# Patient Record
Sex: Male | Born: 1950 | Race: Black or African American | Hispanic: No | Marital: Married | Smoking: Never smoker
Health system: Southern US, Community
[De-identification: ages and names within clinical notes are randomized; demographics above are authoritative.]

## PROBLEM LIST (undated history)

## (undated) DIAGNOSIS — I10 Essential (primary) hypertension: Secondary | ICD-10-CM

## (undated) DIAGNOSIS — E119 Type 2 diabetes mellitus without complications: Secondary | ICD-10-CM

## (undated) HISTORY — PX: APPENDECTOMY: SHX54

---

## 2016-10-10 ENCOUNTER — Emergency Department (HOSPITAL_BASED_OUTPATIENT_CLINIC_OR_DEPARTMENT_OTHER): Payer: 59

## 2016-10-10 ENCOUNTER — Emergency Department (HOSPITAL_BASED_OUTPATIENT_CLINIC_OR_DEPARTMENT_OTHER)
Admission: EM | Admit: 2016-10-10 | Discharge: 2016-10-10 | Disposition: A | Discharge status: Home / Self Care | Payer: 59 | Attending: Emergency Medicine | Admitting: Emergency Medicine

## 2016-10-10 ENCOUNTER — Encounter (HOSPITAL_BASED_OUTPATIENT_CLINIC_OR_DEPARTMENT_OTHER): Payer: Self-pay

## 2016-10-10 DIAGNOSIS — I493 Ventricular premature depolarization: Secondary | ICD-10-CM | POA: Diagnosis not present

## 2016-10-10 DIAGNOSIS — R0789 Other chest pain: Secondary | ICD-10-CM

## 2016-10-10 DIAGNOSIS — I10 Essential (primary) hypertension: Secondary | ICD-10-CM | POA: Insufficient documentation

## 2016-10-10 DIAGNOSIS — E119 Type 2 diabetes mellitus without complications: Secondary | ICD-10-CM | POA: Diagnosis not present

## 2016-10-10 HISTORY — DX: Essential (primary) hypertension: I10

## 2016-10-10 HISTORY — DX: Type 2 diabetes mellitus without complications: E11.9

## 2016-10-10 LAB — BASIC METABOLIC PANEL
ANION GAP: 8 (ref 5–15)
BUN: 13 mg/dL (ref 6–20)
CALCIUM: 8.9 mg/dL (ref 8.9–10.3)
CO2: 27 mmol/L (ref 22–32)
Chloride: 100 mmol/L — ABNORMAL LOW (ref 101–111)
Creatinine, Ser: 0.82 mg/dL (ref 0.61–1.24)
GFR calc non Af Amer: >60 mL/min (ref >60)
Glucose, Bld: 160 mg/dL — ABNORMAL HIGH (ref 65–99)
Potassium: 3.4 mmol/L — ABNORMAL LOW (ref 3.5–5.1)
Sodium: 135 mmol/L (ref 135–145)

## 2016-10-10 LAB — CBC
HCT: 38.6 % — ABNORMAL LOW (ref 39.0–52.0)
HEMOGLOBIN: 12.7 g/dL — AB (ref 13.0–17.0)
MCH: 27.7 pg (ref 26.0–34.0)
MCHC: 32.9 g/dL (ref 30.0–36.0)
MCV: 84.1 fL (ref 78.0–100.0)
Platelets: 217 10*3/uL (ref 150–400)
RBC: 4.59 MIL/uL (ref 4.22–5.81)
RDW: 12.8 % (ref 11.5–15.5)
WBC: 5.1 10*3/uL (ref 4.0–10.5)

## 2016-10-10 LAB — TROPONIN I

## 2016-10-10 NOTE — ED Notes (Signed)
Updated pt to plan of care for three hour troponin.  Pt still denies chest pain, denies any other symptoms still.  Covered pt and wife with warm blankets and shut off the light, they are currently resting.

## 2016-10-10 NOTE — ED Triage Notes (Addendum)
Pt states that he woke about 30 minutes ago and had a "buzz" sensation in his left chest with nausea.  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  Pt denies SOB, denies diaphoresis, denies referred pain to his neck or jaw or back.  Pt took one baby aspirin prior to arrival and his pain has gone away.

## 2016-10-10 NOTE — ED Notes (Signed)
Pt verbalizes understanding of d/c instructions and denies any further needs at this time. 

## 2016-10-10 NOTE — ED Provider Notes (Signed)
[facility] Provider Note: Lowella Dell, MD, FACEP  CSN: [id number] MRN: [id number] ARRIVAL: 10/10/16 at 0259 ROOM: [facility]   CHIEF COMPLAINT  Chest Pain   HISTORY OF PRESENT ILLNESS  Andrew Cohen is a 66 y.o. male who developed a mild (3/10), vaguely characterized ("almost like a numbness") left chest discomfort this morning about 2:30 AM. The discomfort was not exacerbated with movement or exertion and was not relieved with rest. There was no associated shortness of breath or diaphoresis. He did develop some nausea and took an aspirin tablet with relief of the nausea. The discomfort resolved about the time he arrived in the ED. He has never had a similar discomfort in the past. He was noted to have frequent PVCs but has a history of the same.   Past Medical History:  Diagnosis Date  . Diabetes mellitus without complication (HCC)   . Hypertension     Past Surgical History:  Procedure Laterality Date  . APPENDECTOMY      No family history on file.  Social History  Substance Use Topics  . Smoking status: Never Smoker  . Smokeless tobacco: Never Used  . Alcohol use Yes     Comment: occasionally    Prior to Admission medications   Not on File    Allergies Patient has no known allergies.   REVIEW OF SYSTEMS  Negative except as noted here or in the History of Present Illness.   PHYSICAL EXAMINATION  Initial Vital Signs Blood pressure 135/83, pulse 72, temperature 97.8 F (36.6 C), temperature source Oral, resp. rate 20, height 5\' 10"  (1.778 m), weight 282 lb (127.9 kg), SpO2 96 %.  Examination General: Well-developed, well-nourished male in no acute distress; appearance consistent with age of record HENT: normocephalic; atraumatic Eyes: pupils equal, round and reactive to light; extraocular muscles intact Neck: supple Heart: regular rate and rhythm; no murmurs, rubs or gallops; frequent PVCs with no sustained runs Lungs: clear to auscultation  bilaterally Abdomen: soft; nondistended; nontender; no masses or hepatosplenomegaly; bowel sounds present Extremities: No deformity; full range of motion; pulses normal; trace edema of lower legs Neurologic: Awake, alert and oriented; motor function intact in all extremities and symmetric; no facial droop Skin: Warm and dry Psychiatric: Normal mood and affect   RESULTS  Summary of this visit's results, reviewed by myself:   EKG Interpretation  Date/Time:  Friday October 10 2016 03:08:07 EST Ventricular Rate:  75 PR Interval:    QRS Duration: 111 QT Interval:  420 QTC Calculation: 470 R Axis:   -19 Text Interpretation:  Sinus rhythm Multiple ventricular premature complexes Borderline left axis deviation Low voltage, precordial leads Borderline T abnormalities, anterior leads Baseline wander in lead(s) II III aVF No previous ECGs available Confirmed by Morris Markham  MD, Jonny Ruiz ([id number]) on 10/10/2016 3:46:31 AM      Laboratory Studies: Results for orders placed or performed during the hospital encounter of 10/10/16 (from the past 24 hour(s))  Basic metabolic panel     Status: Abnormal   Collection Time: 10/10/16  3:30 AM  Result Value Ref Range   Sodium 135 135 - 145 mmol/L   Potassium 3.4 (L) 3.5 - 5.1 mmol/L   Chloride 100 (L) 101 - 111 mmol/L   CO2 27 22 - 32 mmol/L   Glucose, Bld 160 (H) 65 - 99 mg/dL   BUN 13 6 - 20 mg/dL   Creatinine, Ser 6.21 0.61 - 1.24 mg/dL   Calcium 8.9 8.9 - 30.8 mg/dL  GFR calc non Af Amer >60 >60 mL/min   GFR calc Af Amer >60 >60 mL/min   Anion gap 8 5 - 15  CBC     Status: Abnormal   Collection Time: 10/10/16  3:30 AM  Result Value Ref Range   WBC 5.1 4.0 - 10.5 K/uL   RBC 4.59 4.22 - 5.81 MIL/uL   Hemoglobin 12.7 (L) 13.0 - 17.0 g/dL   HCT 16.138.6 (L) 09.639.0 - 04.552.0 %   MCV 84.1 78.0 - 100.0 fL   MCH 27.7 26.0 - 34.0 pg   MCHC 32.9 30.0 - 36.0 g/dL   RDW 40.912.8 81.111.5 - 91.415.5 %   Platelets 217 150 - 400 K/uL  Troponin I     Status: None   Collection Time:  10/10/16  3:30 AM  Result Value Ref Range   Troponin I <0.03 <0.03 ng/mL  Troponin I     Status: None   Collection Time: 10/10/16  6:05 AM  Result Value Ref Range   Troponin I <0.03 <0.03 ng/mL   Imaging Studies: Dg Chest 2 View  Result Date: 10/10/2016 CLINICAL DATA:  Fuzzy  sensation in left upper chest 1 hour ago EXAM: CHEST  2 VIEW COMPARISON:  03/17/2016 FINDINGS: Prominent interstitial opacities suggest bronchial thickening. No acute consolidation or effusion. Normal cardiomediastinal silhouette. No pneumothorax. IMPRESSION: No radiographic evidence for acute cardiopulmonary abnormality Electronically Signed   By: Jasmine PangKim  Fujinaga M.D.   On: 10/10/2016 03:52    ED COURSE  Nursing notes and initial vitals signs, including pulse oximetry, reviewed.  Vitals:   10/10/16 0316 10/10/16 0317 10/10/16 0400 10/10/16 0530  BP:  135/83 100/59 115/61  Pulse:  72 66 64  Resp:  20 13 13   Temp:  97.8 F (36.6 C)    TempSrc:  Oral    SpO2:  96% 98% 96%  Weight: 282 lb (127.9 kg)     Height: 5\' 10"  (1.778 m)      6:39 AM Patient has been asymptomatic while in the ED. 2 troponins have been negative. He continues to have ABCs but as noted this is not a new diagnosis for him.  PROCEDURES    ED DIAGNOSES     ICD-9-CM ICD-10-CM   1. Atypical chest pain 786.59 R07.89   2. PVC's (premature ventricular contractions) 427.69 I49.3        Paula LibraJohn Mendy Lapinsky, MD 10/10/16 [phone]

## 2018-02-25 IMAGING — DX DG CHEST 2V
2 series · 2 of 2 positions shown · non-contrast
Comparison: 03/17/2016

CLINICAL DATA: Fuzzy  sensation in left upper chest 1 hour ago

EXAM:
CHEST  2 VIEW

[chest pa]
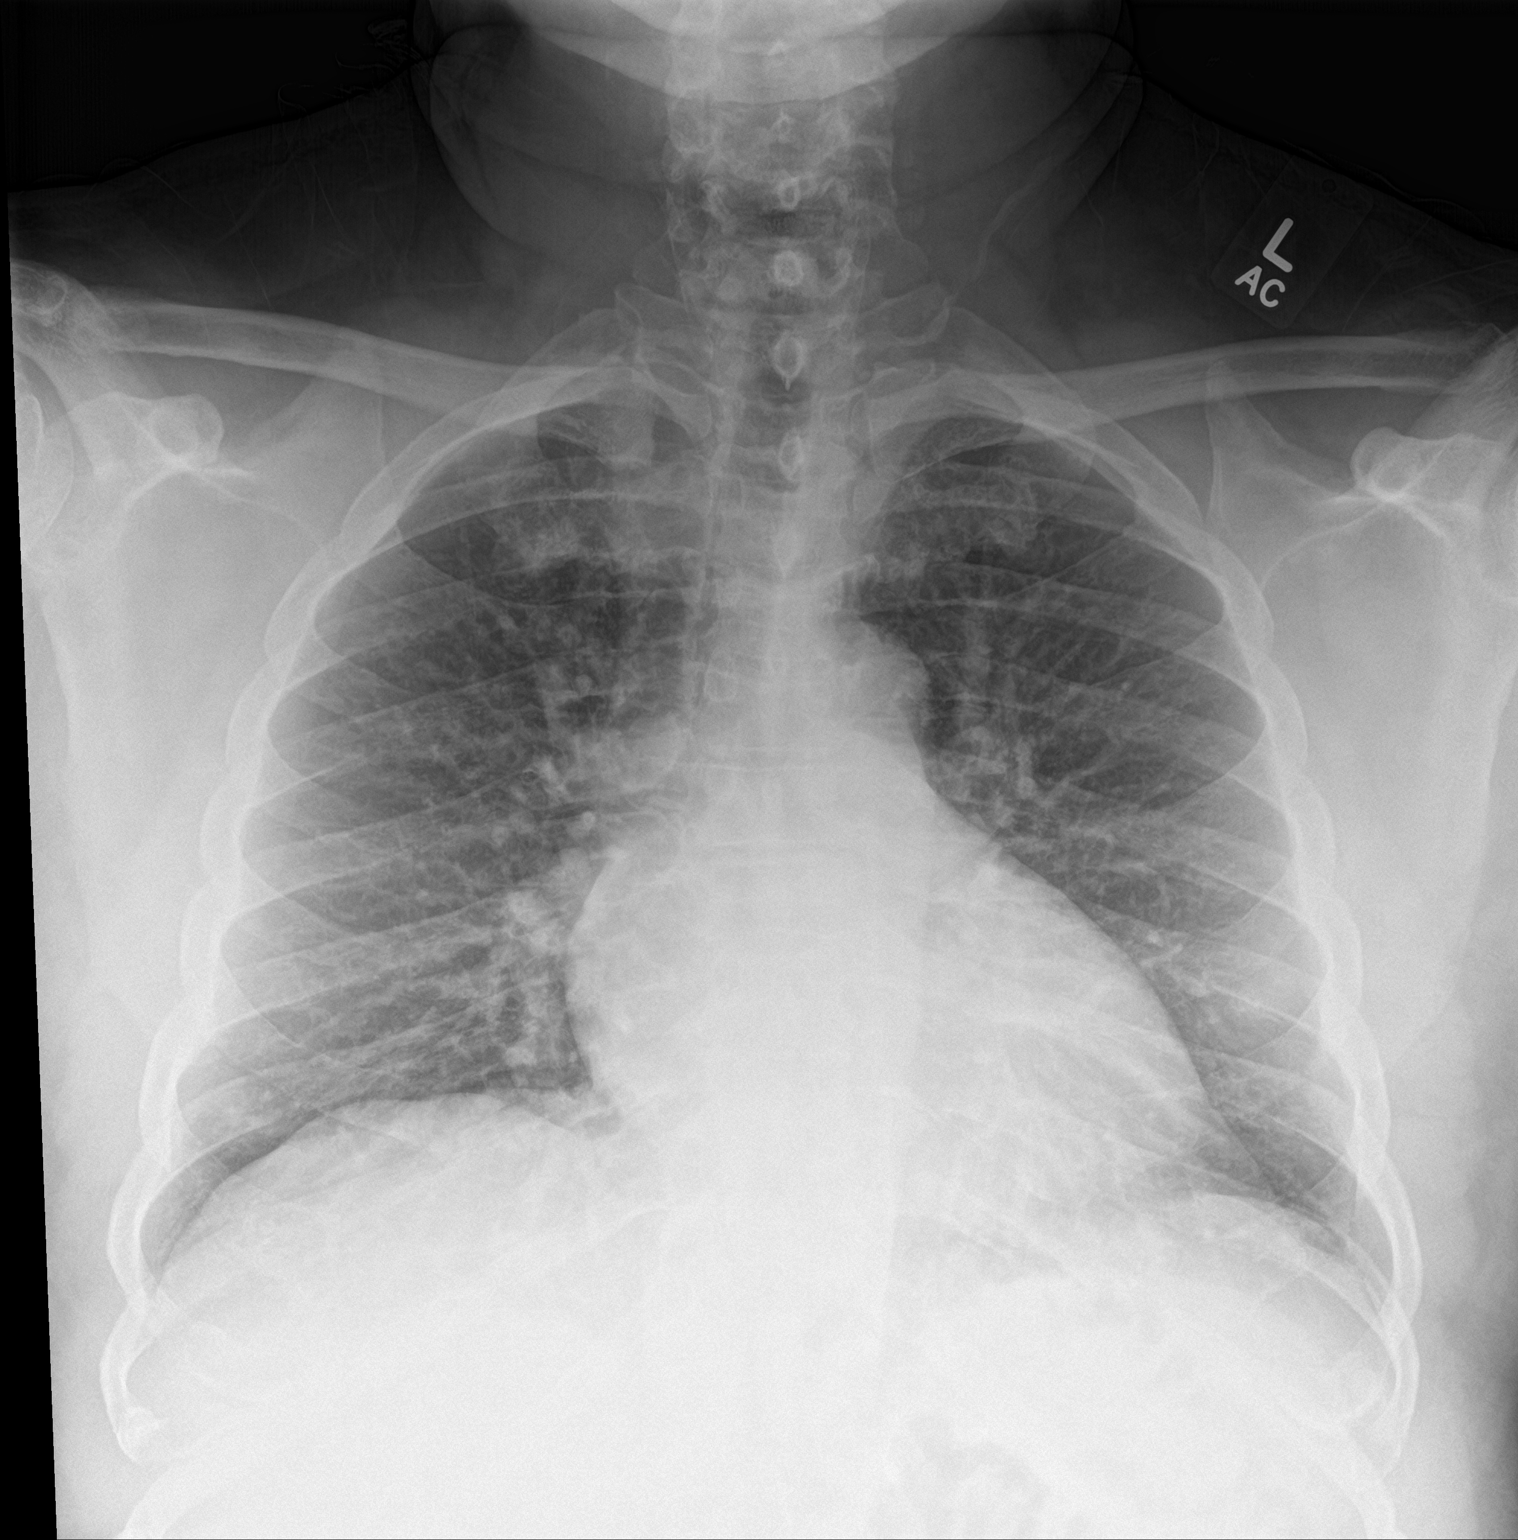

[chest lat]
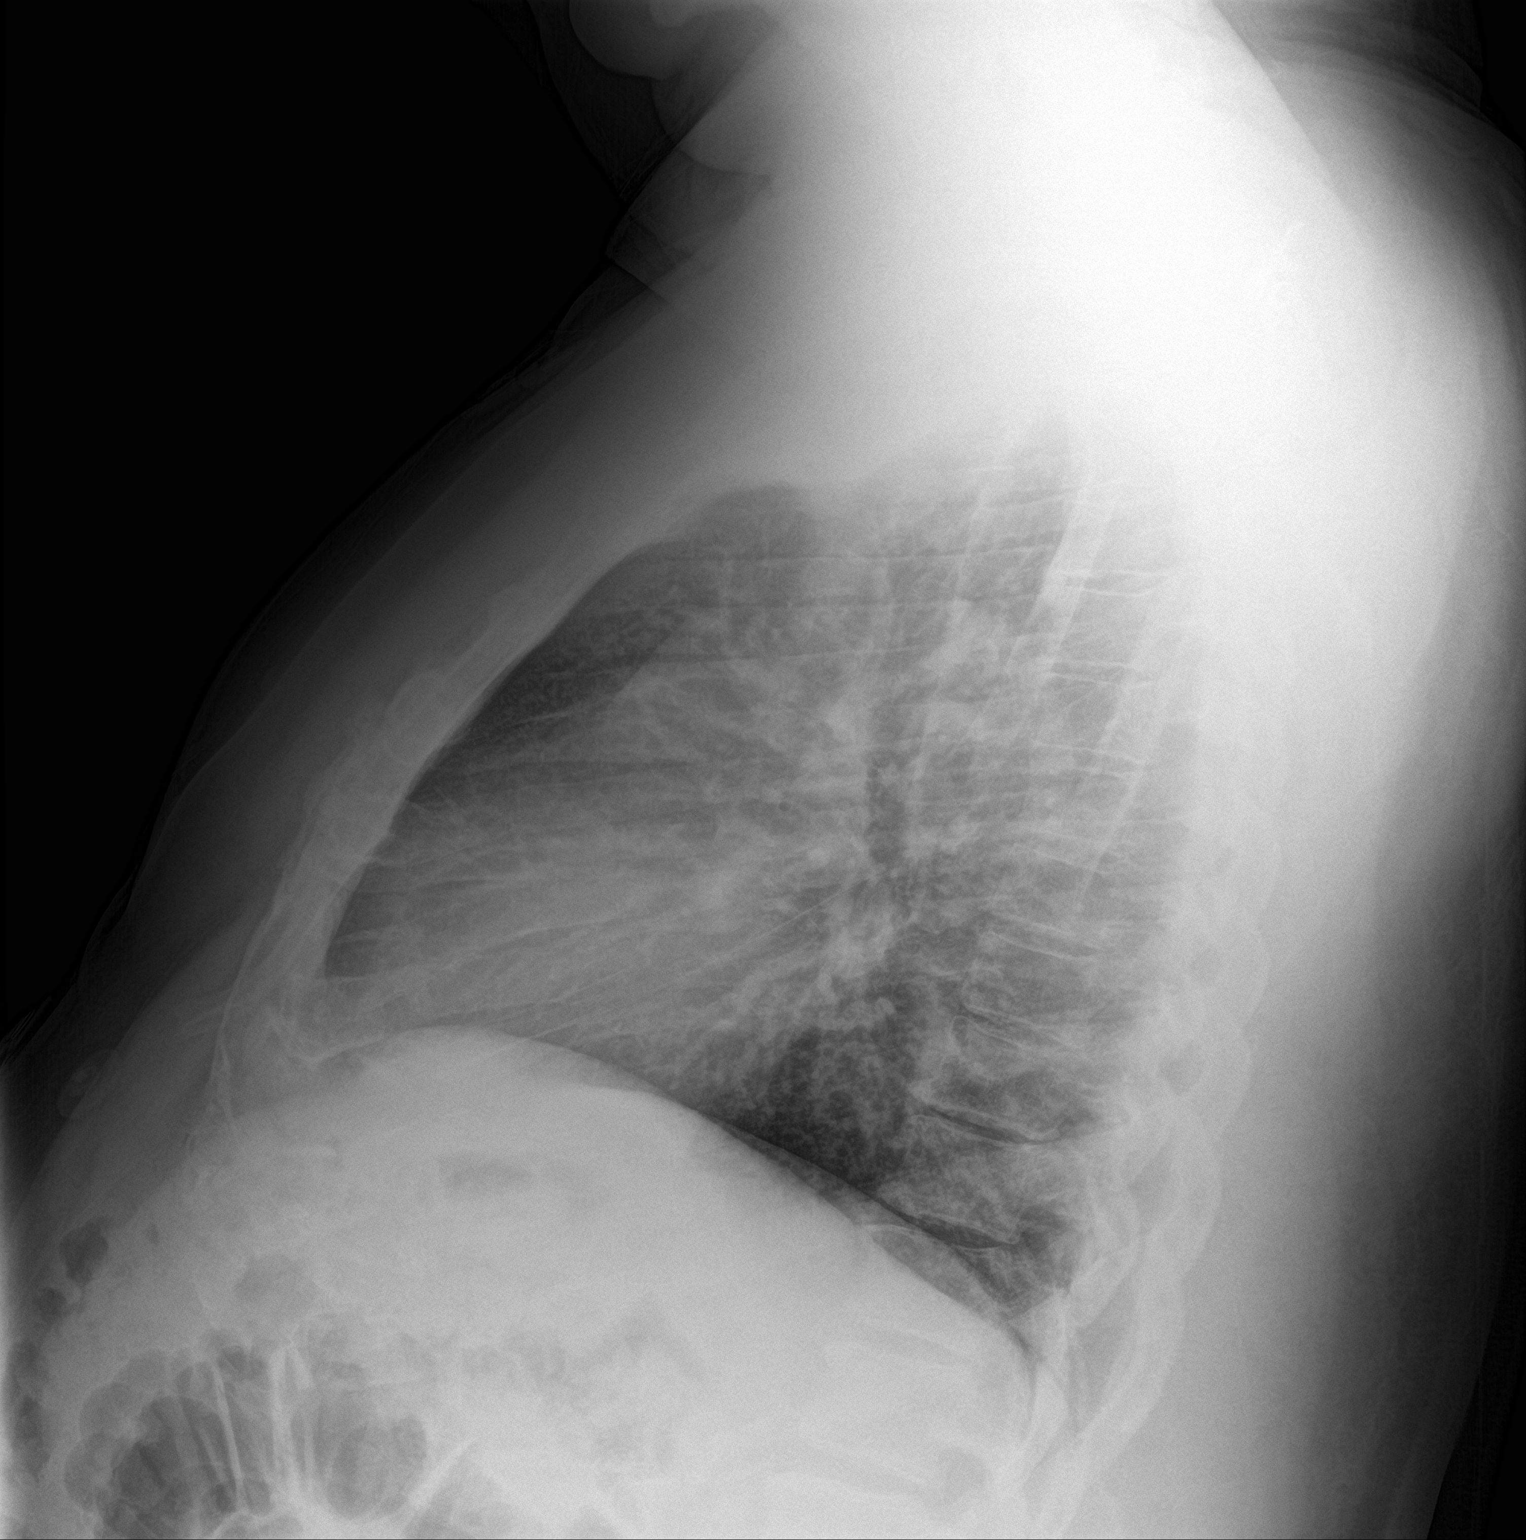

[2 of 2 positions shown; findings below may reference images not displayed]

FINDINGS: Prominent interstitial opacities suggest bronchial thickening. No
acute consolidation or effusion. Normal cardiomediastinal
silhouette. No pneumothorax.
IMPRESSION: No radiographic evidence for acute cardiopulmonary abnormality

## 2022-04-13 ENCOUNTER — Emergency Department (HOSPITAL_BASED_OUTPATIENT_CLINIC_OR_DEPARTMENT_OTHER)
Admission: EM | Admit: 2022-04-13 | Discharge: 2022-04-13 | Disposition: A | Discharge status: Home / Self Care | Payer: Medicare Other | Attending: Emergency Medicine | Admitting: Emergency Medicine

## 2022-04-13 ENCOUNTER — Emergency Department (HOSPITAL_BASED_OUTPATIENT_CLINIC_OR_DEPARTMENT_OTHER): Payer: Medicare Other

## 2022-04-13 ENCOUNTER — Other Ambulatory Visit: Payer: Self-pay

## 2022-04-13 ENCOUNTER — Encounter (HOSPITAL_BASED_OUTPATIENT_CLINIC_OR_DEPARTMENT_OTHER): Payer: Self-pay | Admitting: Emergency Medicine

## 2022-04-13 DIAGNOSIS — M25462 Effusion, left knee: Secondary | ICD-10-CM | POA: Diagnosis not present

## 2022-04-13 DIAGNOSIS — M25562 Pain in left knee: Secondary | ICD-10-CM | POA: Insufficient documentation

## 2022-04-13 NOTE — ED Triage Notes (Signed)
Reports swelling to left knee that he noticed this evening.

## 2022-04-13 NOTE — ED Notes (Signed)
D/c paperwork reviewed with pt, including f/u care. Pt verbalized understanding, all questions addressed prior to d/c. Pt ambulatory to ED exit with s/o.

## 2022-04-13 NOTE — Discharge Instructions (Signed)
Your xray today was unremarkable and did not help explain your symptoms. You have significant swelling in front of your knee cap. I recommend calling Sports Medicine tomorrow morning to be evaluated.  Please return if you start developing pain in the knee, redness or warmth overlying the joint, or fevers.

## 2022-04-13 NOTE — ED Notes (Signed)
X-ray at bedside

## 2022-04-13 NOTE — ED Provider Notes (Signed)
[facility] EMERGENCY DEPARTMENT Provider Note   CSN: [id number] Arrival date & time: 04/13/22  2202     History PMH: HTN, DM Chief Complaint  Patient presents with   Knee Pain    Andrew Cohen is a 71 y.o. male. Patient presents to the ED with a chief complaint of left knee swelling.  He says he looked down today and he noticed that his knee Was very swollen.  He does not remember injuring this.  He has never had problems with this knee in the past.  He has no pain with it.  He is able to move his knee in all directions without any discomfort.  He denies any fevers or chills.  No history of gout.  He has never seen orthopedic provider.  States that he is very active normally and walks very often.  This has not affected his ability to walk.   Knee Pain      Home Medications Prior to Admission medications   Not on File      Allergies    Patient has no known allergies.    Review of Systems   Review of Systems  Musculoskeletal:  Positive for joint swelling.  All other systems reviewed and are negative.   Physical Exam Updated Vital Signs BP (!) 149/83 (BP Location: Left Arm)   Pulse 88   Temp 98.7 F (37.1 C) (Oral)   Resp 18   Ht 5\' 10"  (1.778 m)   Wt 117.9 kg   SpO2 99%   BMI 37.31 kg/m  Physical Exam Vitals and nursing note reviewed.  Constitutional:      General: He is not in acute distress.    Appearance: Normal appearance. He is well-developed. He is not ill-appearing, toxic-appearing or diaphoretic.  HENT:     Head: Normocephalic and atraumatic.     Nose: No nasal deformity.     Mouth/Throat:     Lips: Pink. No lesions.  Eyes:     General: Gaze aligned appropriately. No scleral icterus.       Right eye: No discharge.        Left eye: No discharge.     Conjunctiva/sclera: Conjunctivae normal.     Right eye: Right conjunctiva is not injected. No exudate or hemorrhage.    Left eye: Left conjunctiva is not injected. No exudate or  hemorrhage. Pulmonary:     Effort: Pulmonary effort is normal. No respiratory distress.  Musculoskeletal:     Comments: Left knee with swelling inferior to the patella. There is no erythema or tenderness to palpation.  Fluctuance to palpation with + balottement. He has normal active range of motion in all directions without any pain or difficulty.  He has normal 2+ pedal pulses.  Sensation is intact.  Skin:    General: Skin is warm and dry.  Neurological:     Mental Status: He is alert and oriented to person, place, and time.  Psychiatric:        Mood and Affect: Mood normal.        Speech: Speech normal.        Behavior: Behavior normal. Behavior is cooperative.     ED Results / Procedures / Treatments   Labs (all labs ordered are listed, but only abnormal results are displayed) Labs Reviewed - No data to display  EKG None  Radiology DG Knee Complete 4 Views Left  Result Date: 04/13/2022 CLINICAL DATA:  Left knee swelling. EXAM: LEFT KNEE - COMPLETE 4+ VIEW  COMPARISON:  None Available. FINDINGS: No acute fracture or dislocation. Possible trace suprapatellar joint effusion. Mild joint space narrowing and osteophyte formation is noted in the medial and patellofemoral compartments. Soft tissue swelling is present anterior to the patella. Vascular calcifications are noted in the soft tissues. IMPRESSION: 1. No acute fracture or dislocation. 2. Marked soft tissue swelling anterior to the patella. 3. Mild degenerative changes at the knee. 4. Possible trace suprapatellar joint effusion. Electronically Signed   By: Thornell Sartorius M.D.   On: 04/13/2022 23:00    Procedures Procedures   Medications Ordered in ED Medications - No data to display  ED Course/ Medical Decision Making/ A&P                           Medical Decision Making Amount and/or Complexity of Data Reviewed Radiology: ordered.   Patient presents with left knee swelling. HDS, afebrile. Exam reassuring. No infectious  symptoms. Normal ROM without pain. Does not seem consistent with septic joint or bursitis. Obtaining x ray to evaluate. Xray reveals no fracture or dislocation. There is significant prepatellar swelling. Without any pain or any ROM, joint aspiration in the ED is not indicated. Recommend f/u with Sports Medicine or Ortho for further management.   Final Clinical Impression(s) / ED Diagnoses Final diagnoses:  Swelling of joint of left knee    Rx / DC Orders ED Discharge Orders     None         Claudie Leach, PA-C 04/13/22 2308    Milagros Loll, MD 04/13/22 [phone]
